# Patient Record
Sex: Male | Born: 1995 | Race: White | Hispanic: No | Marital: Single | Smoking: Never smoker
Health system: Southern US, Community
[De-identification: ages and names within clinical notes are randomized; demographics above are authoritative.]

---
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
## 2018-07-22 ENCOUNTER — Ambulatory Visit (INDEPENDENT_AMBULATORY_CARE_PROVIDER_SITE_OTHER): Payer: BLUE CROSS/BLUE SHIELD | Admitting: Urology

## 2018-07-22 ENCOUNTER — Encounter: Payer: Self-pay | Admitting: Urology

## 2018-07-22 VITALS — BP 132/64 | HR 56 | Ht 72.0 in | Wt 186.0 lb

## 2018-07-22 DIAGNOSIS — I861 Scrotal varices: Secondary | ICD-10-CM

## 2018-07-22 DIAGNOSIS — R300 Dysuria: Secondary | ICD-10-CM | POA: Diagnosis not present

## 2018-07-22 DIAGNOSIS — R339 Retention of urine, unspecified: Secondary | ICD-10-CM | POA: Diagnosis not present

## 2018-07-22 DIAGNOSIS — R102 Pelvic and perineal pain: Secondary | ICD-10-CM

## 2018-07-22 DIAGNOSIS — R3913 Splitting of urinary stream: Secondary | ICD-10-CM | POA: Diagnosis not present

## 2018-07-22 DIAGNOSIS — M259 Joint disorder, unspecified: Secondary | ICD-10-CM | POA: Insufficient documentation

## 2018-07-22 DIAGNOSIS — R3911 Hesitancy of micturition: Secondary | ICD-10-CM | POA: Diagnosis not present

## 2018-07-22 LAB — URINALYSIS, COMPLETE
BILIRUBIN UA: NEGATIVE
Glucose, UA: NEGATIVE
KETONES UA: NEGATIVE
LEUKOCYTES UA: NEGATIVE
NITRITE UA: NEGATIVE
PH UA: 6.5 (ref 5.0–7.5)
Protein, UA: NEGATIVE
RBC UA: NEGATIVE
SPEC GRAV UA: 1.01 (ref 1.005–1.030)
UUROB: 0.2 mg/dL (ref 0.2–1.0)

## 2018-07-22 LAB — BLADDER SCAN AMB NON-IMAGING

## 2018-07-22 MED ORDER — ALFUZOSIN HCL ER 10 MG PO TB24: 10.0000 mg | ORAL_TABLET | Freq: Every day | ORAL | 0 refills | Status: AC

## 2018-07-22 NOTE — Progress Notes (Signed)
07/22/2018 9:35 AM   Jay Murphy 05-Nov-1995 [id number]  Referring provider: Kennyth Lose, PA [address] [place], [place] [address]  Chief Complaint  Patient presents with  . Establish Care    HPI: 22 year old male presents with an approximately 4 month history of intermittent dysuria and suprapubic pain.  His symptoms were initially intermittent however over the last 2 months are continuous.  He also has urinary hesitancy and an intermittent urinary stream.  He denies gross hematuria.  He denies flank or scrotal pain.  No previous history of urethral catheter or instrumentation.  Denies prior urologic history or evaluation.  He has been seen at urgent care on 2 occasions and states his urinalysis was clear and he underwent STD testing.  He was treated with empiric antibiotics on both occasions.  At his most recent visit he was found to have a left varicocele and was referred here for further evaluation.   PMH: No past medical history on file.  Surgical History: No past surgical history on file.  Home Medications:  Allergies as of 07/22/2018      Reactions   Oxycodone Anaphylaxis   Codeine Swelling   Swelling throat and tongue, itching      Medication List    as of 07/22/2018  9:35 AM   You have not been prescribed any medications.     Allergies:  Allergies  Allergen Reactions  . Oxycodone Anaphylaxis  . Codeine Swelling    Swelling throat and tongue, itching    Family History: No family history on file.  Social History:  reports that he has never smoked. He has never used smokeless tobacco. His alcohol and drug histories are not on file.  ROS: UROLOGY Frequent Urination?: No Hard to postpone urination?: No Burning/pain with urination?: Yes Get up at night to urinate?: No Leakage of urine?: No Urine stream starts and stops?: Yes Trouble starting stream?: Yes Do you have to strain to urinate?: No Blood in urine?: No Urinary tract  infection?: No Sexually transmitted disease?: No Injury to kidneys or bladder?: No Painful intercourse?: No Weak stream?: No Erection problems?: Yes Penile pain?: Yes  Gastrointestinal Nausea?: No Vomiting?: No Indigestion/heartburn?: No Diarrhea?: Yes Constipation?: No  Constitutional Fever: No Night sweats?: No Weight loss?: No Fatigue?: No  Skin Skin rash/lesions?: Yes Itching?: Yes  Eyes Blurred vision?: No Double vision?: No  Ears/Nose/Throat Sore throat?: No Sinus problems?: No  Hematologic/Lymphatic Swollen glands?: No Easy bruising?: No  Cardiovascular Leg swelling?: No Chest pain?: No  Respiratory Cough?: No Shortness of breath?: No  Endocrine Excessive thirst?: No  Musculoskeletal Back pain?: Yes Joint pain?: No  Neurological Headaches?: No Dizziness?: No  Psychologic Depression?: No Anxiety?: No  Physical Exam: BP 132/64 (BP Location: Left Arm, Patient Position: Sitting, Cuff Size: Normal)   Pulse (!) 56   Ht 6' (1.829 m)   Wt 186 lb (84.4 kg)   BMI 25.23 kg/m   Constitutional:  Alert and oriented, No acute distress. HEENT: [place] AT, moist mucus membranes.  Trachea midline, no masses. Cardiovascular: No clubbing, cyanosis, or edema. Respiratory: Normal respiratory effort, no increased work of breathing. GI: Abdomen is soft, nontender, nondistended, no abdominal masses GU: No CVA tenderness.  Penis circumcised without lesions.  Meatus normal in appearance.  Testes descended bilaterally without masses or tenderness.  epididymes palpably normal.  Moderate left varicocele Lymph: No cervical or inguinal lymphadenopathy. Skin: No rashes, bruises or suspicious lesions. Neurologic: Grossly intact, no focal deficits, moving all 4  extremities. Psychiatric: Normal mood and affect.  Laboratory Data:  Urinalysis Dipstick/microscopy negative  Assessment & Plan:   22 year old male with lower urinary tract symptoms and mild dysuria as well  as suprapubic pain.  Urinalysis today was clear.  PVR by bladder scan was 70 mL.  Potential etiologies were discussed including prostatic inflammation and urethral stricture.  Will initially give a 30-day alpha-blocker trial and if no improvement would recommend cystoscopy.  We will tentatively schedule in the office if needed however he was given the option of performing an same-day surgery under sedation.  He was informed that approximately 20% of males have varicoceles and this is an incidental finding and would not be a cause of his symptoms.   Riki AltesScott C Cortland Crehan, MD  [facility] [address], [address] [place], [place] [address] [phone]

## 2018-07-27 ENCOUNTER — Ambulatory Visit: Payer: Self-pay | Admitting: Urology

## 2018-08-19 ENCOUNTER — Ambulatory Visit (INDEPENDENT_AMBULATORY_CARE_PROVIDER_SITE_OTHER): Payer: Medicaid Other | Admitting: Urology

## 2018-08-19 ENCOUNTER — Encounter: Payer: Self-pay | Admitting: Urology

## 2018-08-19 VITALS — BP 99/66 | HR 90 | Ht 72.0 in | Wt 183.6 lb

## 2018-08-19 DIAGNOSIS — R399 Unspecified symptoms and signs involving the genitourinary system: Secondary | ICD-10-CM | POA: Diagnosis not present

## 2018-08-19 DIAGNOSIS — R109 Unspecified abdominal pain: Secondary | ICD-10-CM

## 2018-08-19 LAB — URINALYSIS, COMPLETE
Bilirubin, UA: NEGATIVE
Glucose, UA: NEGATIVE
KETONES UA: NEGATIVE
Leukocytes, UA: NEGATIVE
NITRITE UA: NEGATIVE
PH UA: 7 (ref 5.0–7.5)
Protein, UA: NEGATIVE
RBC UA: NEGATIVE
SPEC GRAV UA: 1.01 (ref 1.005–1.030)
Urobilinogen, Ur: 0.2 mg/dL (ref 0.2–1.0)

## 2018-08-19 MED ORDER — LIDOCAINE HCL URETHRAL/MUCOSAL 2 % EX GEL
1.0000 "application " | Freq: Once | CUTANEOUS | Status: AC
  Administered 2018-08-19: 1 via URETHRAL

## 2018-08-19 NOTE — Progress Notes (Signed)
   08/19/18  CC:  Chief Complaint  Patient presents with  . Cysto    HPI: See my previous note 07/22/2018.  No real improvement with alfuzosin.  His dysuria however has improved.  He has developed intermittent left flank pain.  Blood pressure 99/66, pulse 90, height 6' (1.829 m), weight 183 lb 9.6 oz (83.3 kg). NED. A&Ox3.     Cystoscopy Procedure Note  Patient identification was confirmed, informed consent was obtained, and patient was prepped using Betadine solution.  Lidocaine jelly was administered per urethral meatus.     Pre-Procedure: - Inspection reveals a normal caliber ureteral meatus.  Procedure: The flexible cystoscope was introduced without difficulty - No urethral strictures/lesions are present. - Normal prostate  - Normal bladder neck - Bilateral ureteral orifices identified - Bladder mucosa  reveals no ulcers, tumors, or lesions - No bladder stones - No trabeculation  Retroflexion shows no abnormalities   Post-Procedure: - Patient tolerated the procedure well  Assessment/ Plan: No evidence of urethral stricture, prostate abnormalities or bladder mucosal abnormalities.  He has developed left flank pain since his last visit.  Recommend scheduling a stone protocol CT of the abdomen pelvis to evaluate for the possibility of a distal ureteral calculus.  He will be notified with results.   Riki AltesScott C Stoioff, MD

## 2018-08-30 ENCOUNTER — Ambulatory Visit
Admission: RE | Admit: 2018-08-30 | Discharge: 2018-08-30 | Disposition: A | Discharge status: Home / Self Care | Payer: Medicaid Other | Source: Ambulatory Visit | Attending: Urology | Admitting: Urology

## 2018-08-30 DIAGNOSIS — R109 Unspecified abdominal pain: Secondary | ICD-10-CM | POA: Insufficient documentation

## 2018-09-01 ENCOUNTER — Telehealth: Payer: Self-pay | Admitting: Family Medicine

## 2018-09-01 NOTE — Telephone Encounter (Signed)
-----   Message from Riki Altes, MD sent at 09/01/2018  8:57 AM EST ----- CT scan showed no urinary tract calculi or other genitourinary abnormalities.

## 2018-09-01 NOTE — Telephone Encounter (Signed)
Patient notified and voiced understanding.

## 2019-04-30 IMAGING — CT CT RENAL STONE PROTOCOL
2 of 4 series · 15 of 46 positions shown, 17 images · non-contrast
Comparison: None.

CLINICAL DATA: 22-year-old male with 4 month history of dysuria and
suprapubic pain. Initially symptoms were intermittent, but have
become continuous over the past 2 months. Urinary hesitancy with
intermittent urinary stream. No gross hematuria. Left flank pain.

EXAM:
CT ABDOMEN AND PELVIS WITHOUT CONTRAST
TECHNIQUE: Multidetector CT imaging of the abdomen and pelvis was performed
following the standard protocol without IV contrast.

[Series 2: renal stone · axial · 0.69mm/px · z∈[-1667,-1227]mm · 12 of 98 slices shown, 14 images (1 of 2)]
[im 5/98  soft-tissue]
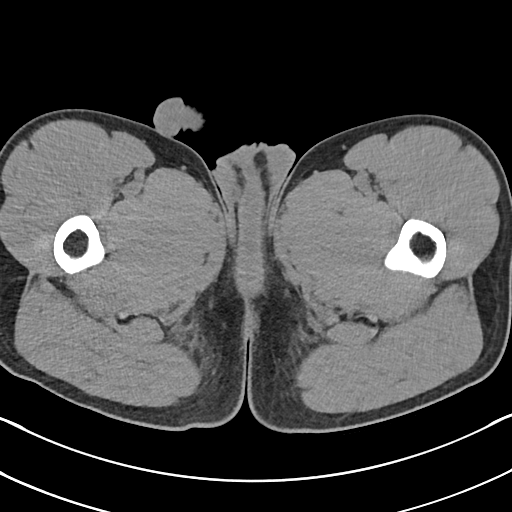
[im 5/98  bone]
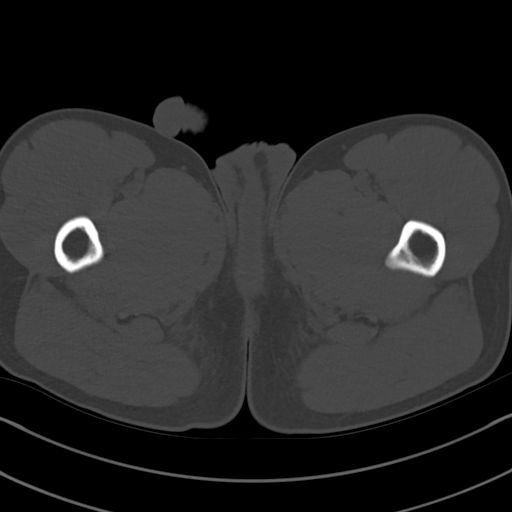
[im 13/98  soft-tissue]
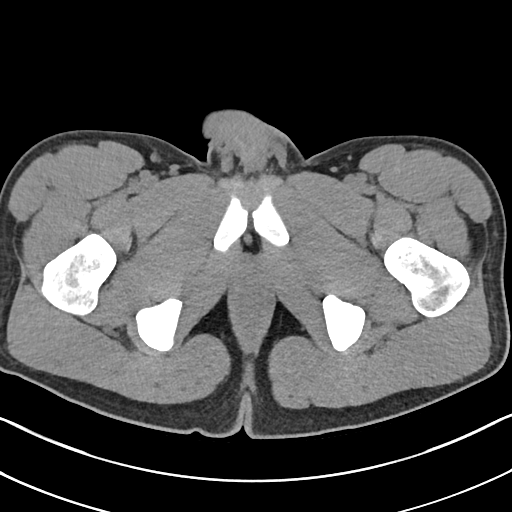
[im 21/98  soft-tissue]
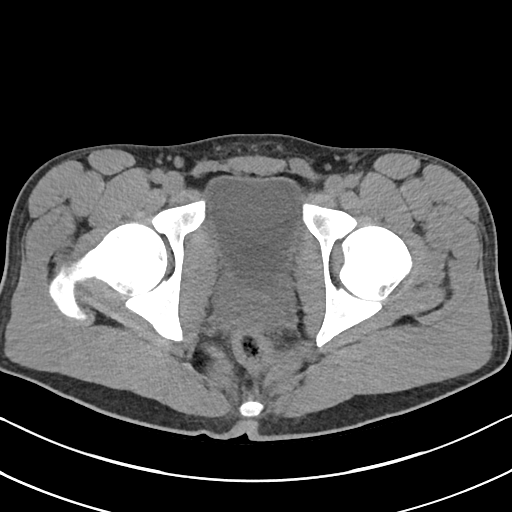
[im 29/98  soft-tissue]
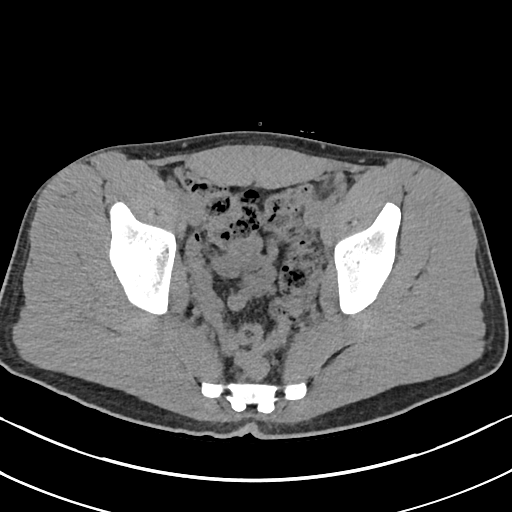
[im 37/98  soft-tissue]
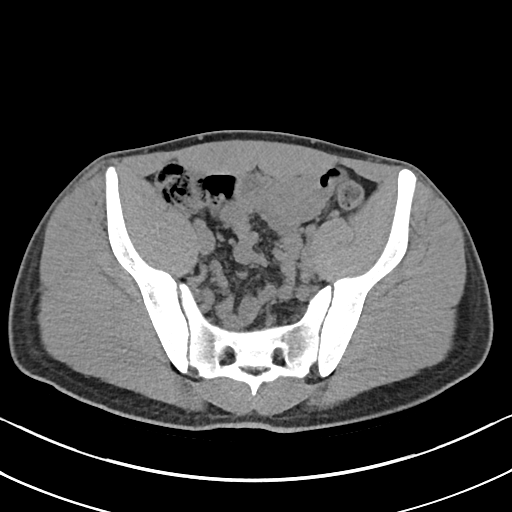
[im 45/98  soft-tissue]
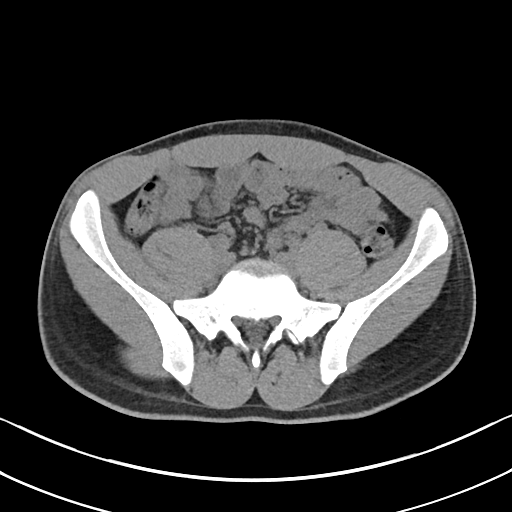
[im 53/98  soft-tissue]
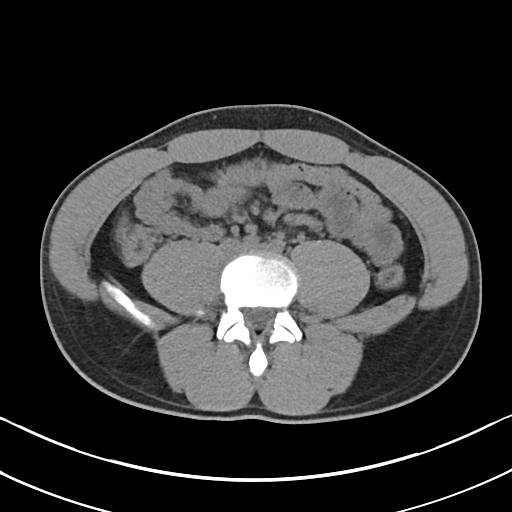
[im 61/98  soft-tissue]
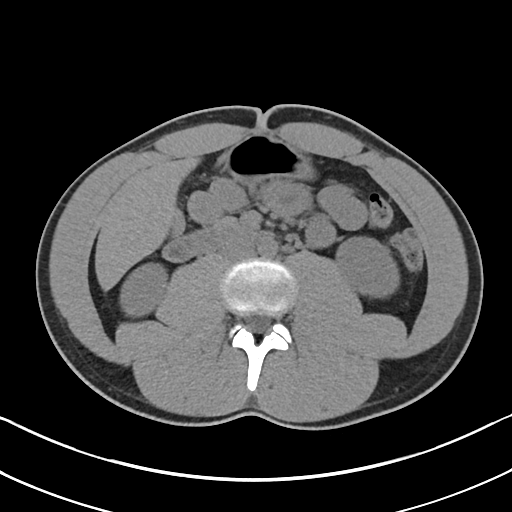
[im 69/98  soft-tissue]
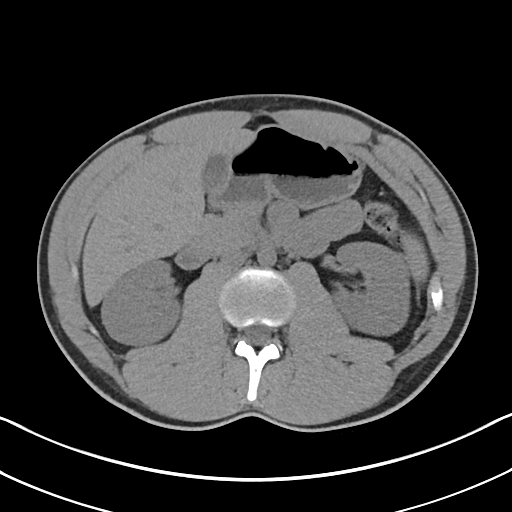
[im 69/98  bone]
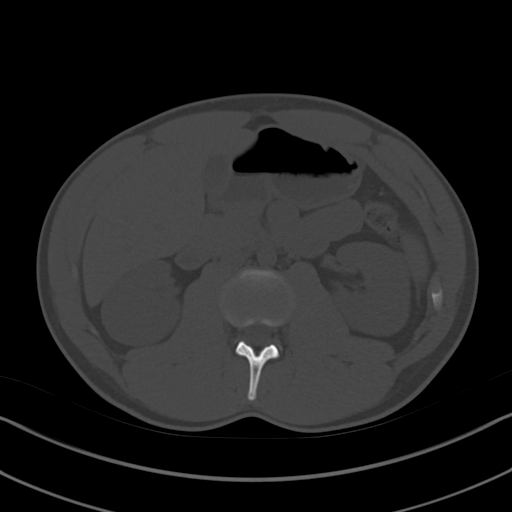
[im 77/98  soft-tissue]
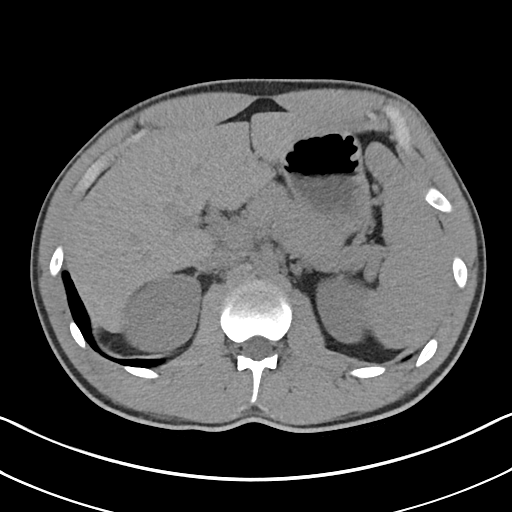
[im 85/98  soft-tissue]
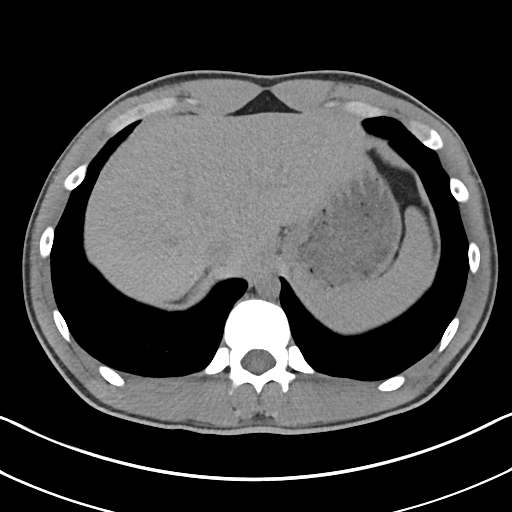
[im 93/98  soft-tissue]
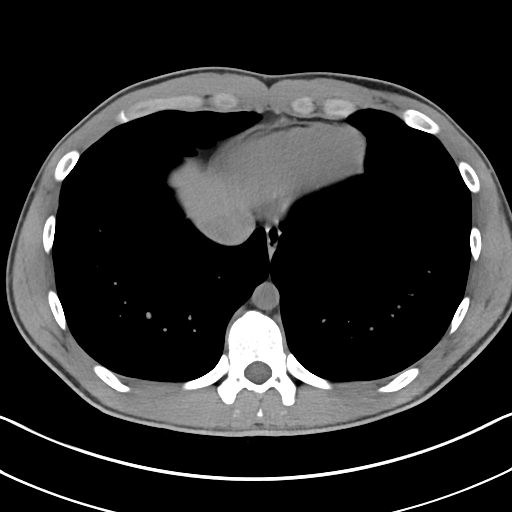

[Series 4: renal stone · coronal · 0.69mm/px · 3 of 130 slices shown (2 of 2)]
[im 44/130  soft-tissue]
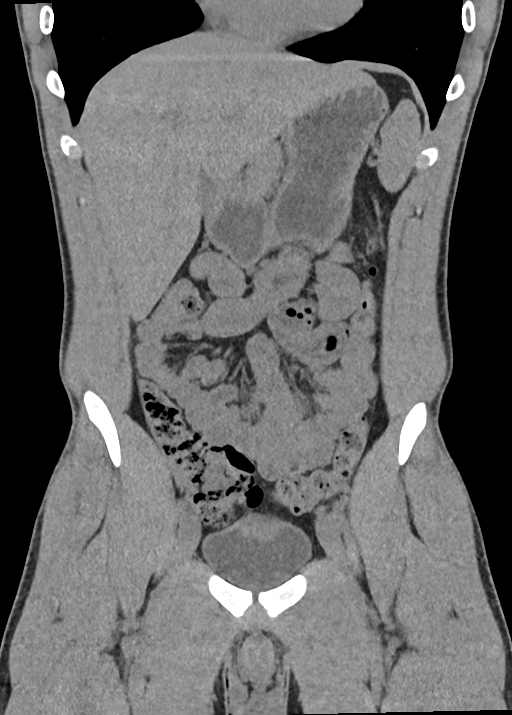
[im 58/130  soft-tissue]
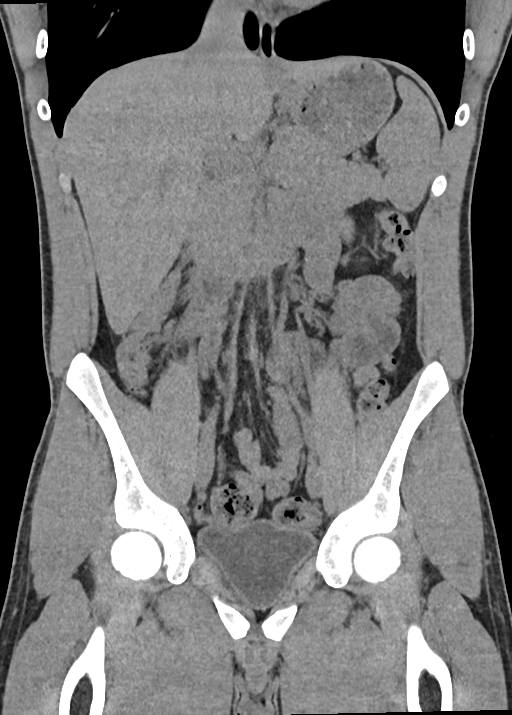
[im 72/130  soft-tissue]
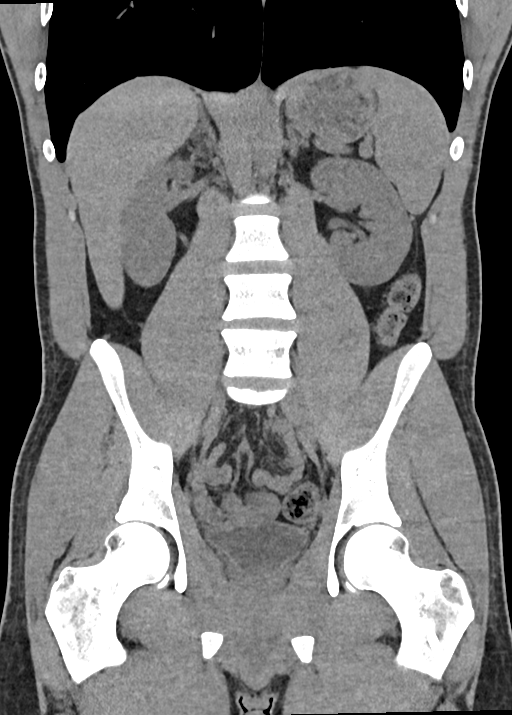

[15 of 46 positions shown; findings below may reference images not displayed]

FINDINGS: Lower chest: Unremarkable.

Hepatobiliary: No definite cystic or solid hepatic lesions are
confidently identified on today's noncontrast CT examination.
Unenhanced appearance of the gallbladder is normal.

Pancreas: No definite pancreatic mass or peripancreatic fluid or
inflammatory changes are noted on today's noncontrast CT
examination.

Spleen: Unremarkable.

Adrenals/Urinary Tract: There are no abnormal calcifications within
the collecting system of either kidney, along the course of either
ureter, or within the lumen of the urinary bladder. No
hydroureteronephrosis or perinephric stranding to suggest urinary
tract obstruction at this time. The unenhanced appearance of the
kidneys is unremarkable bilaterally. Unenhanced appearance of the
urinary bladder is normal. Bilateral adrenal glands are normal in
appearance.

Stomach/Bowel: Unenhanced appearance of the stomach is normal. No
pathologic dilatation of small bowel or colon. Normal appendix.

Vascular/Lymphatic: No atherosclerotic calcifications noted in the
abdominal aorta or pelvic vasculature. No lymphadenopathy noted in
the abdomen or pelvis.

Reproductive: Prostate gland and seminal vesicles are unremarkable
in appearance.

Other: No significant volume of ascites.  No pneumoperitoneum.

Musculoskeletal: There are no aggressive appearing lytic or blastic
lesions noted in the visualized portions of the skeleton.
IMPRESSION: 1. No urinary tract calculi. No findings of urinary tract
obstruction are noted at this time.
2. No acute findings are noted in the abdomen or pelvis to account
for the patient's symptoms.
